# Patient Record
Sex: Female | Born: 1995
Health system: Southern US, Community
[De-identification: ages and names within clinical notes are randomized; demographics above are authoritative.]

## PROBLEM LIST (undated history)

## (undated) DIAGNOSIS — B019 Varicella without complication: Secondary | ICD-10-CM

## (undated) DIAGNOSIS — F419 Anxiety disorder, unspecified: Secondary | ICD-10-CM

## (undated) HISTORY — PX: WISDOM TOOTH EXTRACTION: SHX21

## (undated) HISTORY — DX: Anxiety disorder, unspecified: F41.9

## (undated) HISTORY — DX: Varicella without complication: B01.9

---
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
## 2014-05-12 DIAGNOSIS — E282 Polycystic ovarian syndrome: Secondary | ICD-10-CM

## 2014-05-12 HISTORY — DX: Polycystic ovarian syndrome: E28.2

## 2015-01-24 ENCOUNTER — Encounter: Payer: Self-pay | Admitting: Nurse Practitioner

## 2015-01-24 ENCOUNTER — Ambulatory Visit (INDEPENDENT_AMBULATORY_CARE_PROVIDER_SITE_OTHER): Payer: No Typology Code available for payment source | Admitting: Nurse Practitioner

## 2015-01-24 ENCOUNTER — Encounter (INDEPENDENT_AMBULATORY_CARE_PROVIDER_SITE_OTHER): Payer: Self-pay

## 2015-01-24 VITALS — BP 106/80 | HR 67 | Temp 97.9°F | Resp 14 | Ht 64.0 in | Wt 181.4 lb

## 2015-01-24 DIAGNOSIS — N926 Irregular menstruation, unspecified: Secondary | ICD-10-CM | POA: Diagnosis not present

## 2015-01-24 DIAGNOSIS — L680 Hirsutism: Secondary | ICD-10-CM

## 2015-01-24 DIAGNOSIS — Z7189 Other specified counseling: Secondary | ICD-10-CM | POA: Diagnosis not present

## 2015-01-24 DIAGNOSIS — Z7689 Persons encountering health services in other specified circumstances: Secondary | ICD-10-CM | POA: Insufficient documentation

## 2015-01-24 DIAGNOSIS — E669 Obesity, unspecified: Secondary | ICD-10-CM

## 2015-01-24 NOTE — Patient Instructions (Signed)
Below is not a diagnosis just some info to read about   Follow up in 1 month.   Polycystic Ovarian Syndrome Polycystic ovarian syndrome (PCOS) is a common hormonal disorder among women of reproductive age. Most women with PCOS grow many small cysts on their ovaries. PCOS can cause problems with your periods and make it difficult to get pregnant. It can also cause an increased risk of miscarriage with pregnancy. If left untreated, PCOS can lead to serious health problems, such as diabetes and heart disease. CAUSES The cause of PCOS is not fully understood, but genetics may be a factor. SIGNS AND SYMPTOMS   Infrequent or no menstrual periods.   Inability to get pregnant (infertility) because of not ovulating.   Increased growth of hair on the face, chest, stomach, back, thumbs, thighs, or toes.   Acne, oily skin, or dandruff.   Pelvic pain.   Weight gain or obesity, usually carrying extra weight around the waist.   Type 2 diabetes.   High cholesterol.   High blood pressure.   Female-pattern baldness or thinning hair.   Patches of thickened and dark brown or black skin on the neck, arms, breasts, or thighs.   Tiny excess flaps of skin (skin tags) in the armpits or neck area.   Excessive snoring and having breathing stop at times while asleep (sleep apnea).   Deepening of the voice.   Gestational diabetes when pregnant.  DIAGNOSIS  There is no single test to diagnose PCOS.   Your health care provider will:   Take a medical history.   Perform a pelvic exam.   Have ultrasonography done.   Check your female and female hormone levels.   Measure glucose or sugar levels in the blood.   Do other blood tests.   If you are producing too many female hormones, your health care provider will make sure it is from PCOS. At the physical exam, your health care provider will want to evaluate the areas of increased hair growth. Try to allow natural hair growth for  a few days before the visit.   During a pelvic exam, the ovaries may be enlarged or swollen because of the increased number of small cysts. This can be seen more easily by using vaginal ultrasonography or screening to examine the ovaries and lining of the uterus (endometrium) for cysts. The uterine lining may become thicker if you have not been having a regular period.  TREATMENT  Because there is no cure for PCOS, it needs to be managed to prevent problems. Treatments are based on your symptoms. Treatment is also based on whether you want to have a baby or whether you need contraception.  Treatment may include:   Progesterone hormone to start a menstrual period.   Birth control pills to make you have regular menstrual periods.   Medicines to make you ovulate, if you want to get pregnant.   Medicines to control your insulin.   Medicine to control your blood pressure.   Medicine and diet to control your high cholesterol and triglycerides in your blood.  Medicine to reduce excessive hair growth.  Surgery, making small holes in the ovary, to decrease the amount of female hormone production. This is done through a long, lighted tube (laparoscope) placed into the pelvis through a tiny incision in the lower abdomen.  HOME CARE INSTRUCTIONS  Only take over-the-counter or prescription medicine as directed by your health care provider.  Pay attention to the foods you eat and your activity  levels. This can help reduce the effects of PCOS.  Keep your weight under control.  Eat foods that are low in carbohydrate and high in fiber.  Exercise regularly. SEEK MEDICAL CARE IF:  Your symptoms do not get better with medicine.  You have new symptoms. Document Released: 08/22/2004 Document Revised: 02/16/2013 Document Reviewed: 10/14/2012 [facility] Patient Information 2015 [place], [place]. This information is not intended to replace advice given to you by your health care provider. Make sure  you discuss any questions you have with your health care provider.

## 2015-01-24 NOTE — Assessment & Plan Note (Signed)
Discussed acute and chronic issues. Reviewed health maintenance measures, PFSHx, and immunizations. Obtain records from previous facility.   

## 2015-01-24 NOTE — Addendum Note (Signed)
Addended by: Carollee Leitz on: 01/24/2015 10:13 AM   Modules accepted: Orders

## 2015-01-24 NOTE — Assessment & Plan Note (Signed)
BMI is 31. Weight gain over the last year of approximately 30-40 pounds. Patient is watching portions and eating healthy foods reportedly. Patient is exercising at the gym twice a week for 1 hour. Will obtain pelvic ultrasound for further investigation and possible PCOS. FU in 1 month

## 2015-01-24 NOTE — Progress Notes (Signed)
Patient ID: Karen Guerra, female    DOB: [birth year]  Age: 19 y.o. MRN: [id number]  CC: Establish Care   HPI Karen Guerra presents for establishing care and CC of irregular periods, wt gain, and hair growth.   1) New pt info:  Immunizations- Unknown; flu refused   Eye Exam- Glasses, Not UTD   LMP- 9/2- 9/7 2016  2) Chronic Problems-  None  3) Acute Problems-  Weight gain- Last year 145-155 lbs Diet- Sparkling water, small portions, tries to eat healthy  Exercise- started going to the gym- treadmill and strength goes 2 x a week for 1 hour   Irregular menses- Has one every other month  Menarchy started in middle school 7th grade 19 yo     Been like this since 19 yo  Cramps- more on left side  Lasts 5-6 days   Heavy at first then lighter at the end   Going through 1 pad or tampon in an hour for 2 days  Hair growth- recently noticed by mother in August. Occurring on neck and some on chin  History Karen Guerra has a past medical history of Chicken pox.   She has past surgical history that includes broken arm (Left, 2000).   Her family history includes Cancer in her maternal grandfather; Diabetes in her maternal grandfather; Heart disease in her paternal grandfather; Hyperlipidemia in her maternal grandfather, maternal grandmother, mother, paternal grandfather, and paternal grandmother; Hypertension in her maternal grandfather.She reports that she has never smoked. She has never used smokeless tobacco. She reports that she does not drink alcohol or use illicit drugs.  No outpatient prescriptions prior to visit.   No facility-administered medications prior to visit.    ROS Review of Systems  Constitutional: Positive for unexpected weight change. Negative for fever, chills, diaphoresis and fatigue.  HENT: Negative for tinnitus and trouble swallowing.   Respiratory: Negative for chest tightness, shortness of breath and wheezing.   Cardiovascular: Negative for chest pain,  palpitations and leg swelling.  Gastrointestinal: Negative for nausea, vomiting, diarrhea and constipation.  Genitourinary: Positive for menstrual problem.  Skin: Negative for rash.  Neurological: Negative for dizziness, weakness, numbness and headaches.  Psychiatric/Behavioral: Negative for suicidal ideas and sleep disturbance. The patient is nervous/anxious.     Objective:  BP 106/80 mmHg  Pulse 67  Temp(Src) 97.9 F (36.6 C)  Resp 14  Ht 5\' 4"  (1.626 m)  Wt 181 lb 6.4 oz (82.283 kg)  BMI 31.12 kg/m2  SpO2 96%  Physical Exam  Constitutional: She is oriented to person, place, and time. She appears well-developed and well-nourished. No distress.  HENT:  Head: Normocephalic and atraumatic.  Right Ear: External ear normal.  Left Ear: External ear normal.  Eyes: Right eye exhibits no discharge. Left eye exhibits no discharge. No scleral icterus.  Glasses  Neck:  Hair growth on neck to chin  Cardiovascular: Normal rate, regular rhythm and normal heart sounds.  Exam reveals no gallop and no friction rub.   No murmur heard. Pulmonary/Chest: Effort normal and breath sounds normal. No respiratory distress. She has no wheezes. She has no rales. She exhibits no tenderness.  Abdominal: Soft. Bowel sounds are normal. She exhibits no distension and no mass. There is no tenderness. There is no rebound and no guarding.  Striae are silver/white on sides of abdomen  Neurological: She is alert and oriented to person, place, and time. No cranial nerve deficit. She exhibits normal muscle tone. Coordination normal.  Skin: Skin is warm and dry.  No rash noted. She is not diaphoretic.  Psychiatric: She has a normal mood and affect. Her behavior is normal. Judgment and thought content normal.  Anxious, making good eye contact   Assessment & Plan:   Karen Guerra was seen today for establish care.  Diagnoses and all orders for this visit:  Obesity -     US Pelvis Limited; Future -     US Transvaginal  Non-OB; Future  Hirsutism -     US Pelvis Limited; Future -     US Transvaginal Non-OB; Future  Irregular menses -     US Pelvis Limited; Future -     US Transvaginal Non-OB; Future  Ms. Pitkin does not currently have medications on file.  No orders of the defined types were placed in this encounter.     Follow-up: Return in about 4 weeks (around 02/21/2015) for [place] results and anxiety/depression.

## 2015-01-24 NOTE — Progress Notes (Signed)
Pre visit review using our clinic review tool, if applicable. No additional management support is needed unless otherwise documented below in the visit note. 

## 2015-01-24 NOTE — Assessment & Plan Note (Signed)
Suspect PCOS. Will obtain pelvic ultrasound. Follow-up in one month for discussion. Patient is currently not using any form of birth control.

## 2015-01-24 NOTE — Assessment & Plan Note (Signed)
New onset. Symptom possibly related to weight gain and irregular menses. Discussed endocrinology follow-up pelvic ultrasound is positive for cysts or other findings. Pt is agreeable. FU in 1 month

## 2015-01-29 ENCOUNTER — Ambulatory Visit: Payer: No Typology Code available for payment source

## 2015-01-31 ENCOUNTER — Ambulatory Visit
Admission: RE | Admit: 2015-01-31 | Discharge: 2015-01-31 | Disposition: A | Discharge status: Home / Self Care | Payer: No Typology Code available for payment source | Source: Ambulatory Visit | Attending: Nurse Practitioner | Admitting: Nurse Practitioner

## 2015-01-31 DIAGNOSIS — N926 Irregular menstruation, unspecified: Secondary | ICD-10-CM | POA: Insufficient documentation

## 2015-01-31 DIAGNOSIS — L680 Hirsutism: Secondary | ICD-10-CM | POA: Diagnosis present

## 2015-01-31 DIAGNOSIS — E669 Obesity, unspecified: Secondary | ICD-10-CM | POA: Diagnosis present

## 2015-02-07 ENCOUNTER — Encounter: Payer: Self-pay | Admitting: *Deleted

## 2015-02-21 ENCOUNTER — Encounter: Payer: Self-pay | Admitting: Nurse Practitioner

## 2015-02-21 ENCOUNTER — Ambulatory Visit (INDEPENDENT_AMBULATORY_CARE_PROVIDER_SITE_OTHER): Payer: No Typology Code available for payment source | Admitting: Nurse Practitioner

## 2015-02-21 VITALS — BP 106/64 | HR 75 | Temp 98.2°F | Resp 12 | Ht 64.0 in | Wt 179.0 lb

## 2015-02-21 DIAGNOSIS — Z7251 High risk heterosexual behavior: Secondary | ICD-10-CM

## 2015-02-21 DIAGNOSIS — N926 Irregular menstruation, unspecified: Secondary | ICD-10-CM | POA: Diagnosis not present

## 2015-02-21 NOTE — Progress Notes (Signed)
Patient ID: Karen Guerra, female    DOB: [birth year]  Age: 19 y.o. MRN: [id number]  CC: Follow-up   HPI Karen Guerra presents for CC of discussion of US results.   1) 01/31/15 had Transvaginal non-OB and Pelvic ultrasound Everything was normal Patient denies starting a period since last visit Patient is slightly frustrated that nothing was on the ultrasound.  Willing to try a OCP. Willing to see OB/GYN- interested in Mirena IUD. Unprotected sexual intercourse since last visit.  History Karen Guerra has a past medical history of Chicken pox.   She has past surgical history that includes broken arm (Left, 2000).   Her family history includes Cancer in her maternal grandfather; Diabetes in her maternal grandfather; Heart disease in her paternal grandfather; Hyperlipidemia in her maternal grandfather, maternal grandmother, mother, paternal grandfather, and paternal grandmother; Hypertension in her maternal grandfather.She reports that she has never smoked. She has never used smokeless tobacco. She reports that she does not drink alcohol or use illicit drugs.  No outpatient prescriptions prior to visit.   No facility-administered medications prior to visit.    ROS Review of Systems  Constitutional: Negative for fever, chills, diaphoresis and fatigue.  HENT:       Hair growth on face and acne  Respiratory: Negative for chest tightness, shortness of breath and wheezing.   Cardiovascular: Negative for chest pain, palpitations and leg swelling.  Gastrointestinal: Negative for nausea, vomiting, abdominal pain, diarrhea and abdominal distention.  Skin: Negative for rash.  Neurological: Negative for dizziness and headaches.  Psychiatric/Behavioral: The patient is not nervous/anxious.     Objective:  BP 106/64 mmHg  Pulse 75  Temp(Src) 98.2 F (36.8 C)  Resp 12  Ht 5\' 4"  (1.626 m)  Wt 179 lb (81.194 kg)  BMI 30.71 kg/m2  SpO2 97%  LMP 01/12/2015  Physical Exam  Constitutional: She  is oriented to person, place, and time. She appears well-developed and well-nourished. No distress.  HENT:  Head: Normocephalic and atraumatic.  Right Ear: External ear normal.  Left Ear: External ear normal.  Neurological: She is alert and oriented to person, place, and time. No cranial nerve deficit. She exhibits normal muscle tone. Coordination normal.  Skin: Skin is warm and dry. No rash noted. She is not diaphoretic.  Hair growth on neck and under chin, mild acne  Psychiatric: She has a normal mood and affect. Her behavior is normal. Judgment and thought content normal.   Assessment & Plan:   Karen Guerra was seen today for follow-up.  Diagnoses and all orders for this visit:  Unprotected sex -     POCT urine pregnancy  Irregular menses   Ms. Ardelle AntonWagoner does not currently have medications on file.  No orders of the defined types were placed in this encounter.     Follow-up: Return if symptoms worsen or fail to improve.

## 2015-02-21 NOTE — Progress Notes (Signed)
Pre visit review using our clinic review tool, if applicable. No additional management support is needed unless otherwise documented below in the visit note. 

## 2015-02-21 NOTE — Assessment & Plan Note (Addendum)
POCT urine pregnancy screen results-  Patient will have to return back because she could not urinate at this visit

## 2015-02-21 NOTE — Assessment & Plan Note (Addendum)
Patient still has amenorrhea. Discussed seeing OB/GYN for further workup for endocrinology for further workup. Patient is interested in seeing OB/GYN for possible insertion of Mirena will try OCP in the meantime. Patient had unprotected sex recently so we will get a urine pregnancy screening today (patient had to return due to unable to urinate)

## 2017-04-09 IMAGING — US US TRANSVAGINAL NON-OB
1 series · 14 of 25 positions shown · non-contrast
Comparison: None

CLINICAL DATA: Irregular menses and hirsutism, weight gain

EXAM:
TRANSABDOMINAL AND TRANSVAGINAL ULTRASOUND OF PELVIS
TECHNIQUE: Both transabdominal and transvaginal ultrasound examinations of the
pelvis were performed. Transabdominal technique was performed for
global imaging of the pelvis including uterus, ovaries, adnexal
regions, and pelvic cul-de-sac. It was necessary to proceed with
endovaginal exam following the transabdominal exam to visualize the
endometrium and ovaries.

[Series 1: us transvaginal non-ob · 0.24mm/px · 14 of 104 slices shown]
[im 1/104]
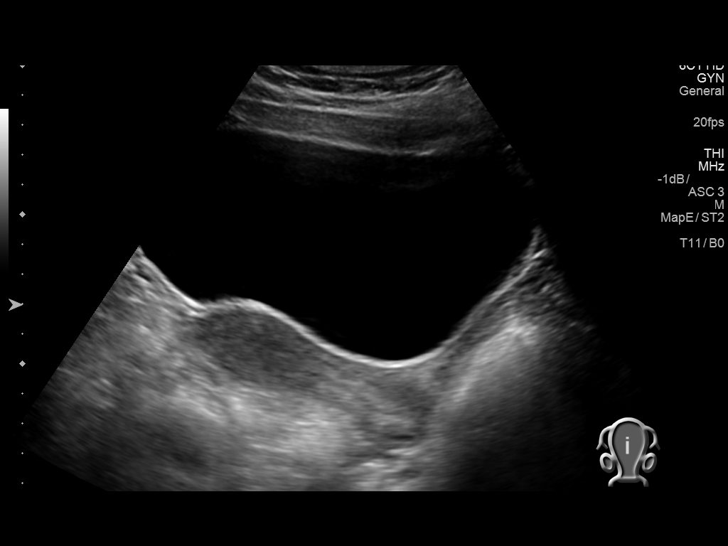
[im 9/104]
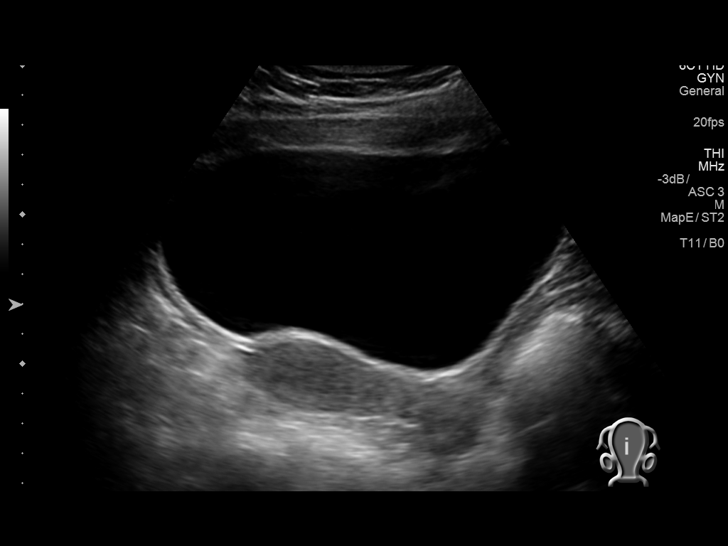
[im 18/104]
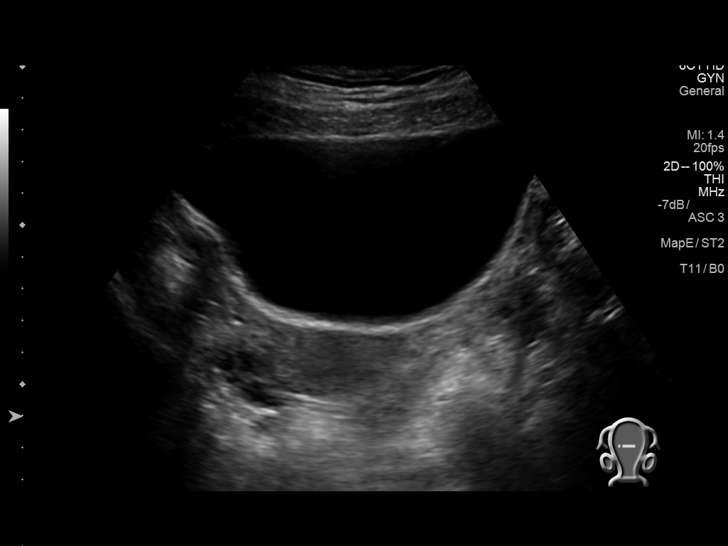
[im 26/104]
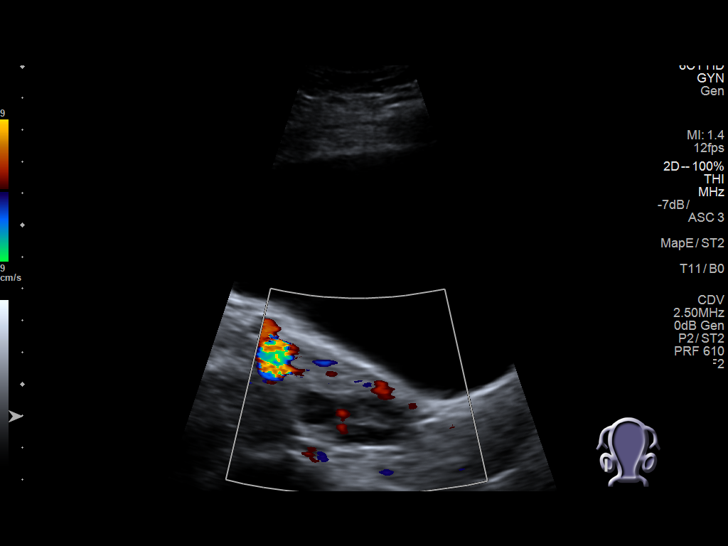
[im 35/104]
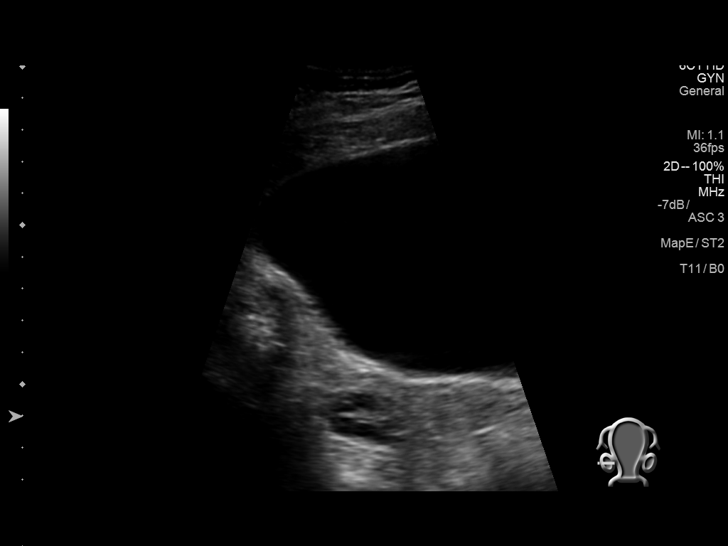
[im 39/104]
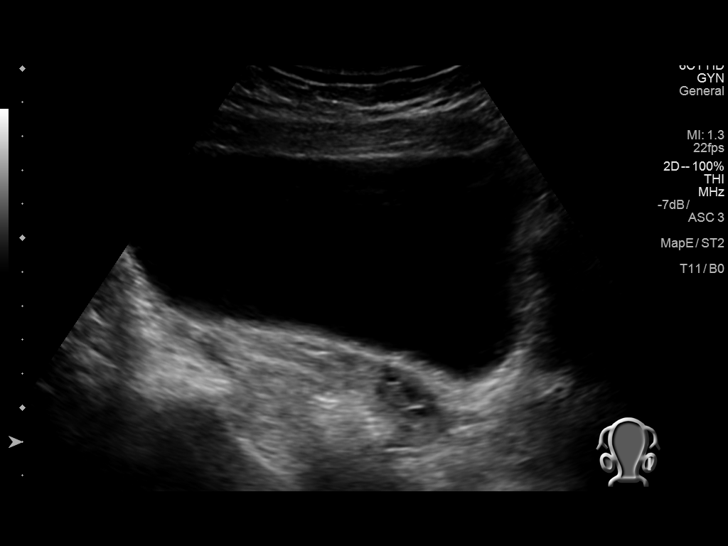
[im 48/104]
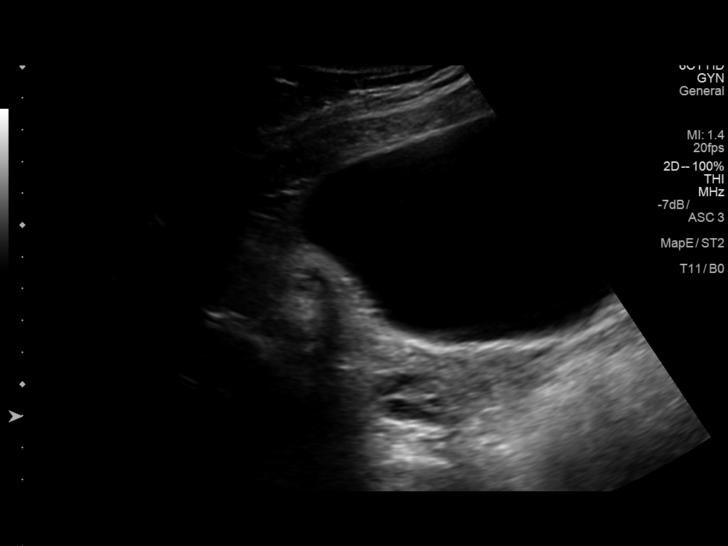
[im 56/104]
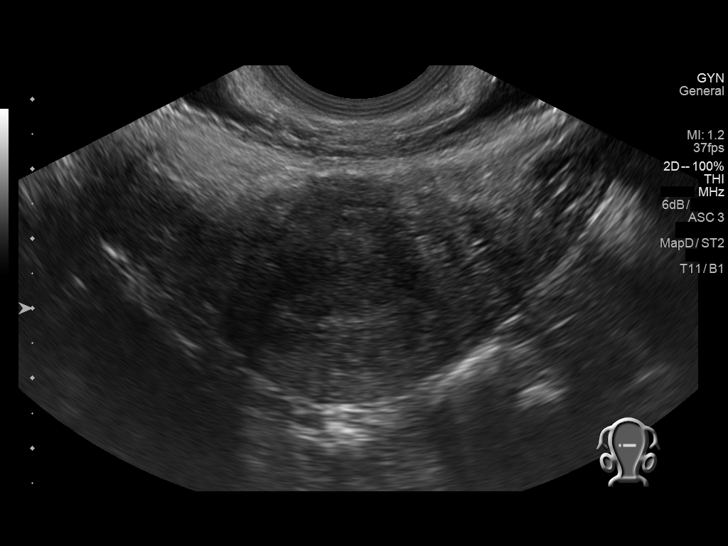
[im 65/104]
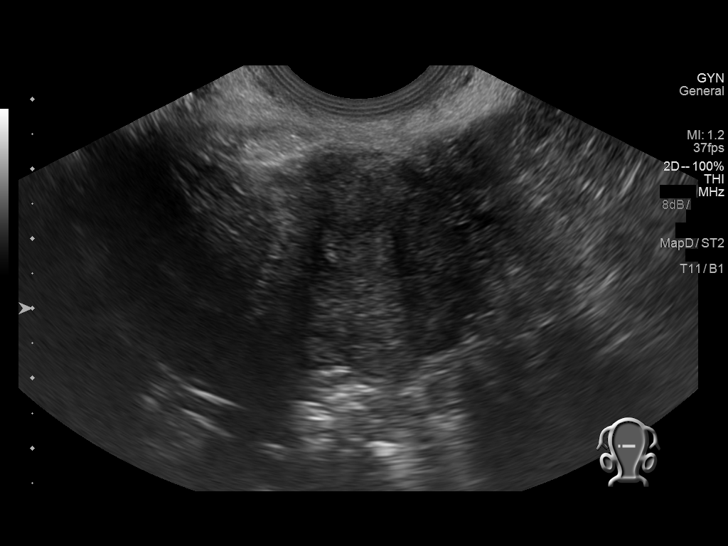
[im 69/104]
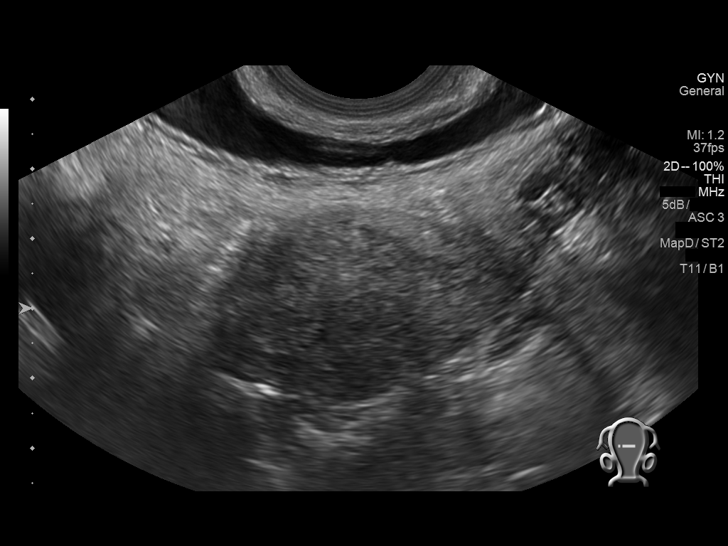
[im 78/104]
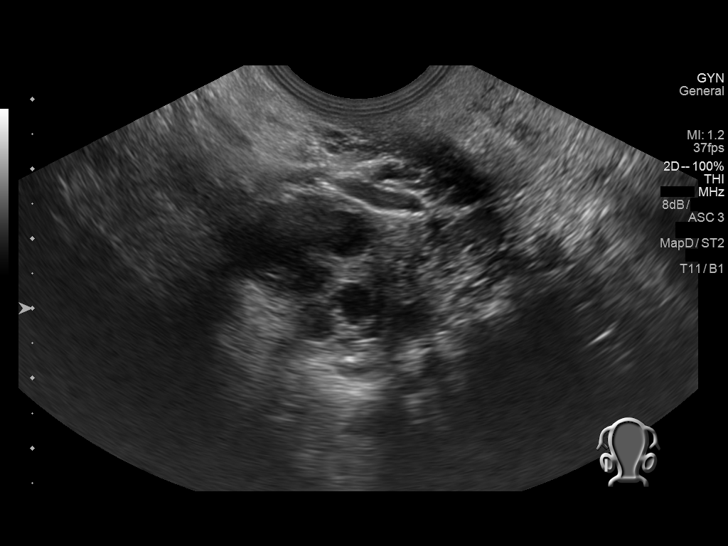
[im 86/104]
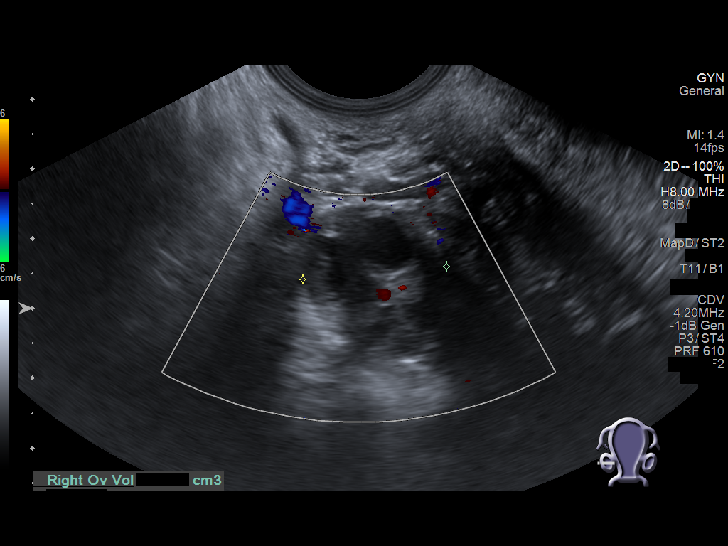
[im 95/104]
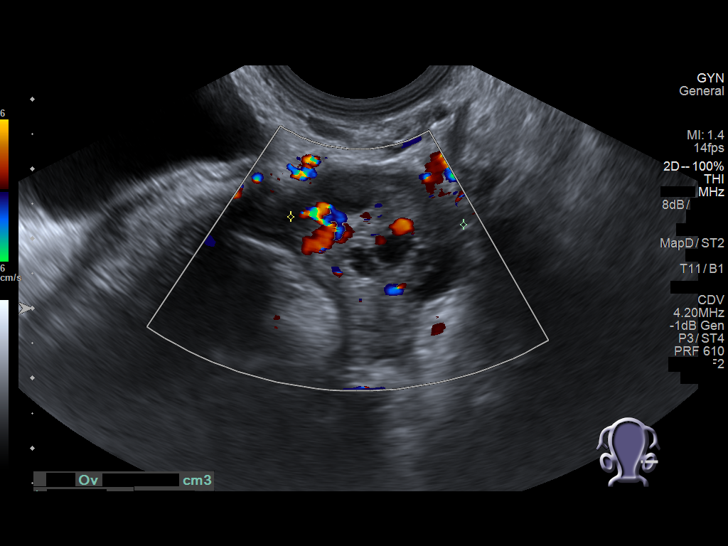
[im 104/104]
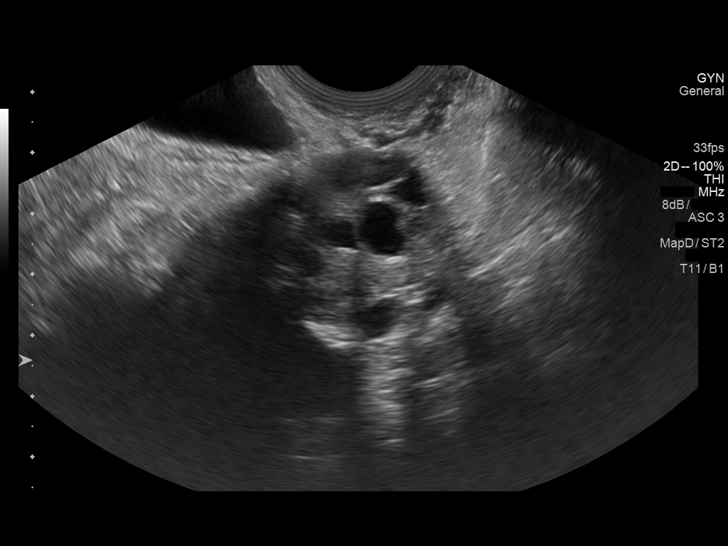

[14 of 25 positions shown; findings below may reference images not displayed]

FINDINGS: Uterus

Measurements: 6.8 x 3.1 x 3.7 cm. No fibroids or other mass
visualized.

Endometrium

Thickness: 7 mm, trilaminar in appearance. No focal abnormality
visualized.

Right ovary

Measurements: 3.2 x 2.0 x 2.1 cm. Normal appearance/no adnexal mass.

Left ovary

Measurements: 3.0 x 2.6 x 2.5 cm. Normal appearance/no adnexal mass.

Other findings

Trace free fluid in the cul-de-sac.
IMPRESSION: Normal exam.

## 2018-03-08 ENCOUNTER — Ambulatory Visit (INDEPENDENT_AMBULATORY_CARE_PROVIDER_SITE_OTHER): Payer: 59 | Admitting: Obstetrics and Gynecology

## 2018-03-08 ENCOUNTER — Other Ambulatory Visit (HOSPITAL_COMMUNITY)
Admission: RE | Admit: 2018-03-08 | Discharge: 2018-03-08 | Disposition: A | Discharge status: Home / Self Care | Payer: 59 | Source: Ambulatory Visit | Attending: Obstetrics and Gynecology | Admitting: Obstetrics and Gynecology

## 2018-03-08 ENCOUNTER — Encounter: Payer: Self-pay | Admitting: Obstetrics and Gynecology

## 2018-03-08 VITALS — BP 110/62 | HR 84 | Ht 65.0 in | Wt 200.0 lb

## 2018-03-08 DIAGNOSIS — F419 Anxiety disorder, unspecified: Principal | ICD-10-CM

## 2018-03-08 DIAGNOSIS — Z124 Encounter for screening for malignant neoplasm of cervix: Secondary | ICD-10-CM | POA: Insufficient documentation

## 2018-03-08 DIAGNOSIS — F329 Major depressive disorder, single episode, unspecified: Secondary | ICD-10-CM

## 2018-03-08 DIAGNOSIS — F418 Other specified anxiety disorders: Secondary | ICD-10-CM

## 2018-03-08 DIAGNOSIS — Z113 Encounter for screening for infections with a predominantly sexual mode of transmission: Secondary | ICD-10-CM

## 2018-03-08 DIAGNOSIS — Z30011 Encounter for initial prescription of contraceptive pills: Secondary | ICD-10-CM | POA: Diagnosis not present

## 2018-03-08 DIAGNOSIS — F32A Depression, unspecified: Secondary | ICD-10-CM

## 2018-03-08 DIAGNOSIS — Z30432 Encounter for removal of intrauterine contraceptive device: Secondary | ICD-10-CM

## 2018-03-08 MED ORDER — SERTRALINE HCL 50 MG PO TABS: 50.0000 mg | ORAL_TABLET | Freq: Every day | ORAL | 1 refills | Status: DC

## 2018-03-08 MED ORDER — NORETHIN-ETH ESTRAD-FE BIPHAS 1 MG-10 MCG / 10 MCG PO TABS: 1.0000 | ORAL_TABLET | Freq: Every day | ORAL | 3 refills | Status: DC

## 2018-03-08 NOTE — Progress Notes (Addendum)
Carollee Leitz, RN   Chief Complaint  Patient presents with  . Contraception    IUD removed, would like to go on [facility] pills, also wants to discuss anxiety/depression medication    HPI:      [name] is a 22 y.o. G0P0000 who LMP was No LMP recorded., presents today for IUD removal and to discuss anxiety/depression sx.  Liletta placed 03/29/15 for [facility]. Pt is not currently sex active but has been in past. Pt is amenorrheic with IUD, random cramping. Pt would like to change to OCPs. Hx of irreg menses prior to IUD. Has never been on OCPs in past. No hx of HTN, DVTs, seizures, migraines.   Pt also with anxiety/depression sx, anxiety worse than depression. Hx of sx in past but worse for past yr. Ready for meds (never done in past).  Has uncontrollable worry, trouble sleeping, feeling nervous, irritable, has decreased motivation, appetite changes. Driving in storms and crowds increase anxiety, but has sx daily overall. No real SI, but has fleeting thoughts. Wouldn't carry through with SI.  FH of sx.    Past Medical History:  Diagnosis Date  . Chicken pox     Past Surgical History:  Procedure Laterality Date  . broken arm Left 2000    Family History  Problem Relation Age of Onset  . Hyperlipidemia Mother   . Hyperlipidemia Maternal Grandmother   . Hypertension Maternal Grandmother   . Cancer Maternal Grandfather        Lung Cancer  . Hyperlipidemia Maternal Grandfather   . Hypertension Maternal Grandfather   . Diabetes Maternal Grandfather   . Hyperlipidemia Paternal Grandmother   . Hyperlipidemia Paternal Grandfather   . Heart disease Paternal Grandfather   . Hypertension Paternal Grandfather   . Hypertension Other   . Hypertension Other   . Prostate cancer Other     Social History   Socioeconomic History  . Marital status: Single    Spouse name: Not on file  . Number of children: Not on file  . Years of education: Not on file  . Highest education level: Not  on file  Occupational History  . Not on file  Social Needs  . Financial resource strain: Not on file  . Food insecurity:    Worry: Not on file    Inability: Not on file  . Transportation needs:    Medical: Not on file    Non-medical: Not on file  Tobacco Use  . Smoking status: Never Smoker  . Smokeless tobacco: Never Used  Substance and Sexual Activity  . Alcohol use: No    Alcohol/week: 0.0 standard drinks  . Drug use: No  . Sexual activity: Not Currently    Partners: Male    Birth control/protection: None  Lifestyle  . Physical activity:    Days per week: Not on file    Minutes per session: Not on file  . Stress: Not on file  Relationships  . Social connections:    Talks on phone: Not on file    Gets together: Not on file    Attends religious service: Not on file    Active member of club or organization: Not on file    Attends meetings of clubs or organizations: Not on file    Relationship status: Not on file  . Intimate partner violence:    Fear of current or ex partner: Not on file    Emotionally abused: Not on file    Physically  abused: Not on file    Forced sexual activity: Not on file  Other Topics Concern  . Not on file  Social History Narrative   Works as a Estate agent- Highest level education   Single   Caffeine- Coffee- 1 cup, occasional tea     Outpatient Medications Prior to Visit  Medication Sig Dispense Refill  . albuterol (VENTOLIN HFA) 108 (90 Base) MCG/ACT inhaler 1-2 inhalations every 4-6 hours as needed for wheezing. Dispense spacer as needed.     No facility-administered medications prior to visit.     ROS:  Review of Systems  Constitutional: Negative for fatigue, fever and unexpected weight change.  Respiratory: Negative for cough, shortness of breath and wheezing.   Cardiovascular: Negative for chest pain, palpitations and leg swelling.  Gastrointestinal: Negative for blood in stool, constipation, diarrhea,  nausea and vomiting.  Endocrine: Negative for cold intolerance, heat intolerance and polyuria.  Genitourinary: Negative for dyspareunia, dysuria, flank pain, frequency, genital sores, hematuria, menstrual problem, pelvic pain, urgency, vaginal bleeding, vaginal discharge and vaginal pain.  Musculoskeletal: Negative for back pain, joint swelling and myalgias.  Skin: Negative for rash.  Neurological: Negative for dizziness, syncope, light-headedness, numbness and headaches.  Hematological: Negative for adenopathy.  Psychiatric/Behavioral: Positive for agitation, dysphoric mood and sleep disturbance. Negative for confusion, self-injury and suicidal ideas. The patient is not nervous/anxious.     OBJECTIVE:   Vitals:  BP 110/62   Pulse 84   Ht 5\' 5"  (1.651 m)   Wt 200 lb (90.7 kg)   BMI 33.28 kg/m   Physical Exam  Constitutional: She is oriented to person, place, and time. Vital signs are normal. She appears well-developed.  Neck: Normal range of motion.  Pulmonary/Chest: Effort normal.  Genitourinary: Vagina normal and uterus normal. There is no rash, tenderness or lesion on the right labia. There is no rash, tenderness or lesion on the left labia. Uterus is not enlarged and not tender. Cervix exhibits no motion tenderness. Right adnexum displays no mass and no tenderness. Left adnexum displays no mass and no tenderness. No erythema or tenderness in the vagina. No vaginal discharge found.  Genitourinary Comments: IUD STRINGS IN PLACE  Musculoskeletal: Normal range of motion.  Neurological: She is alert and oriented to person, place, and time. No cranial nerve deficit.  Psychiatric: She has a normal mood and affect. Her behavior is normal. Judgment and thought content normal.  Vitals reviewed.   Results:  GAD 7 : Generalized Anxiety Score 03/08/2018  Nervous, Anxious, on Edge 2  Control/stop worrying 3  Worry too much - different things 2  Trouble relaxing 3  Restless 1  Easily  annoyed or irritable 3  Afraid - awful might happen 1  Total GAD 7 Score 15  Anxiety Difficulty Very difficult     Depression screen PHQ 2/9 03/08/2018  Decreased Interest 2  Down, Depressed, Hopeless 1  PHQ - 2 Score 3  Altered sleeping 3  Tired, decreased energy 3  Change in appetite 3  Feeling bad or failure about yourself  2  Trouble concentrating 1  Moving slowly or fidgety/restless 2  Suicidal thoughts 1  PHQ-9 Score 18  Difficult doing work/chores Very difficult     Assessment/Plan: Anxiety and depression - Sx and tx discussed. Start zoloft in 1 wk (starting OCPs today). Rx eRxd. RTO in 8 wks for f/u/sooner prn. - Plan: sertraline (ZOLOFT) 50 MG tablet  Screening for STD (sexually transmitted disease) - Plan: Cytology -  PAP  Cervical cancer screening - Plan: Cytology - PAP  Encounter for IUD removal - Removed without problems.   Encounter for initial prescription of contraceptive pills - OCP start today. Condoms. 1 sample/coupon card; Rx Lo Loestrin.  - Plan: Norethindrone-Ethinyl Estradiol-Fe Biphas (LO LOESTRIN FE) 1 MG-10 MCG / 10 MCG tablet    Meds ordered this encounter  Medications  . sertraline (ZOLOFT) 50 MG tablet    Sig: Take 1 tablet (50 mg total) by mouth daily. Take 1/2 tab daily for 6 days, then 1 tab daily    Dispense:  30 tablet    Refill:  1    Order Specific Question:   Supervising Provider    Answer:   Nadara Mustard [id number]  . Norethindrone-Ethinyl Estradiol-Fe Biphas (LO LOESTRIN FE) 1 MG-10 MCG / 10 MCG tablet    Sig: Take 1 tablet by mouth daily.    Dispense:  84 tablet    Refill:  3    Order Specific Question:   Supervising Provider    Answer:   Nadara Mustard [[id number]]    IUD Removal Strings of IUD identified and grasped.  IUD removed without problem with ring forceps.  Pt tolerated this well.  IUD noted to be intact.   Plan: IUD removed and plan for contraception is oral contraceptives (estrogen/progesterone). She was  amenable to this plan.   Return in about 8 weeks (around 05/03/2018) for anxiety/depression f/u.  Alicia B. Copland, PA-C 03/08/2018 11:00 AM

## 2018-03-08 NOTE — Patient Instructions (Signed)
I value your feedback and entrusting us with your care. If you get a [facility] patient survey, I would appreciate you taking the time to let us know about your experience today. Thank you! 

## 2018-03-10 LAB — CYTOLOGY - PAP
Chlamydia: NEGATIVE
Diagnosis: NEGATIVE
Neisseria Gonorrhea: NEGATIVE
Trichomonas: NEGATIVE

## 2018-05-26 ENCOUNTER — Other Ambulatory Visit: Payer: Self-pay | Admitting: Obstetrics and Gynecology

## 2018-05-26 DIAGNOSIS — F329 Major depressive disorder, single episode, unspecified: Secondary | ICD-10-CM

## 2018-05-26 DIAGNOSIS — F419 Anxiety disorder, unspecified: Principal | ICD-10-CM

## 2018-05-26 NOTE — Telephone Encounter (Signed)
Please advise 

## 2018-06-14 ENCOUNTER — Encounter: Payer: Self-pay | Admitting: Obstetrics and Gynecology

## 2018-06-14 ENCOUNTER — Ambulatory Visit (INDEPENDENT_AMBULATORY_CARE_PROVIDER_SITE_OTHER): Payer: BLUE CROSS/BLUE SHIELD | Admitting: Obstetrics and Gynecology

## 2018-06-14 VITALS — BP 100/60 | Ht 65.0 in | Wt 196.0 lb

## 2018-06-14 DIAGNOSIS — F419 Anxiety disorder, unspecified: Secondary | ICD-10-CM

## 2018-06-14 DIAGNOSIS — Z30014 Encounter for initial prescription of intrauterine contraceptive device: Secondary | ICD-10-CM

## 2018-06-14 DIAGNOSIS — F32A Depression, unspecified: Secondary | ICD-10-CM

## 2018-06-14 DIAGNOSIS — F329 Major depressive disorder, single episode, unspecified: Secondary | ICD-10-CM

## 2018-06-14 MED ORDER — MISOPROSTOL 100 MCG PO TABS: 100.0000 ug | ORAL_TABLET | Freq: Once | ORAL | 0 refills | Status: DC

## 2018-06-14 MED ORDER — SERTRALINE HCL 50 MG PO TABS: 50.0000 mg | ORAL_TABLET | Freq: Every day | ORAL | 2 refills | Status: DC

## 2018-06-14 NOTE — Patient Instructions (Signed)
I value your feedback and entrusting us with your care. If you get a [facility] patient survey, I would appreciate you taking the time to let us know about your experience today. Thank you! 

## 2018-06-14 NOTE — Progress Notes (Signed)
Karen Guerra, Karen M, RN   Chief Complaint  Patient presents with  . Contraception    iud  . Anxiety    HPI:      Karen Guerra is a 23 y.o. G0P0000 who LMP was Patient's last menstrual period was 05/26/2018., presents today for anxiety/depression f/u. Started on zoloft 10/19 and doing really well. Takes at night since it makes her a little tired. Anxiety/depression sx much improved. Still has occas chest pains with anxiety. Worry, motivation, insomnia much improved. No SI. Wants to cont meds.   03/08/18 NOTE: Pt also with anxiety/depression sx, anxiety worse than depression. Hx of sx in past but worse for past yr. Ready for meds (never done in past).  Has uncontrollable worry, trouble sleeping, feeling nervous, irritable, has decreased motivation, appetite changes. Driving in storms and crowds increase anxiety, but has sx daily overall. No real SI, but has fleeting thoughts. Wouldn't carry through with SI.  FH of sx.   Also wants to go back to IUD.  IUD removed 10/19 because pt wanted to try OCPs. Started Lo Loestrin then. Had bleeding for 1 1/2 wks 1/20 but no periods with placebo pills. Has trouble taking pill daily. Not sex active currently.  Had liletta placed 03/29/15.  Current on pap 10/19.  Past Medical History:  Diagnosis Date  . Chicken pox     Past Surgical History:  Procedure Laterality Date  . broken arm Left 2000    Family History  Problem Relation Age of Onset  . Hyperlipidemia Mother   . Hyperlipidemia Maternal Grandmother   . Hypertension Maternal Grandmother   . Cancer Maternal Grandfather        Lung Cancer  . Hyperlipidemia Maternal Grandfather   . Hypertension Maternal Grandfather   . Diabetes Maternal Grandfather   . Hyperlipidemia Paternal Grandmother   . Hyperlipidemia Paternal Grandfather   . Heart disease Paternal Grandfather   . Hypertension Paternal Grandfather   . Hypertension Other   . Hypertension Other   . Prostate cancer Other      Social History   Socioeconomic History  . Marital status: Single    Spouse name: Not on file  . Number of children: Not on file  . Years of education: Not on file  . Highest education level: Not on file  Occupational History  . Not on file  Social Needs  . Financial resource strain: Not on file  . Food insecurity:    Worry: Not on file    Inability: Not on file  . Transportation needs:    Medical: Not on file    Non-medical: Not on file  Tobacco Use  . Smoking status: Never Smoker  . Smokeless tobacco: Never Used  Substance and Sexual Activity  . Alcohol use: No    Alcohol/week: 0.0 standard drinks  . Drug use: No  . Sexual activity: Not Currently    Partners: Male    Birth control/protection: None  Lifestyle  . Physical activity:    Days per week: Not on file    Minutes per session: Not on file  . Stress: Not on file  Relationships  . Social connections:    Talks on phone: Not on file    Gets together: Not on file    Attends religious service: Not on file    Active member of club or organization: Not on file    Attends meetings of clubs or organizations: Not on file    Relationship status: Not on  file  . Intimate partner violence:    Fear of current or ex partner: Not on file    Emotionally abused: Not on file    Physically abused: Not on file    Forced sexual activity: Not on file  Other Topics Concern  . Not on file  Social History Narrative   Works as a Estate agenthair stylist    [employer]- Highest level education   Single   Caffeine- Coffee- 1 cup, occasional tea     Outpatient Medications Prior to Visit  Medication Sig Dispense Refill  . albuterol (VENTOLIN HFA) 108 (90 Base) MCG/ACT inhaler 1-2 inhalations every 4-6 hours as needed for wheezing. Dispense spacer as needed.    . Norethindrone-Ethinyl Estradiol-Fe Biphas (LO LOESTRIN FE) 1 MG-10 MCG / 10 MCG tablet Take 1 tablet by mouth daily. 84 tablet 3  . sertraline (ZOLOFT) 50 MG tablet Take 1  tablet (50 mg total) by mouth daily. Take 1/2 tab daily for 6 days, then 1 tab daily 30 tablet 1   No facility-administered medications prior to visit.       ROS:  Review of Systems  Constitutional: Negative for fatigue, fever and unexpected weight change.  Respiratory: Negative for cough, shortness of breath and wheezing.   Cardiovascular: Negative for chest pain, palpitations and leg swelling.  Gastrointestinal: Negative for blood in stool, constipation, diarrhea, nausea and vomiting.  Endocrine: Negative for cold intolerance, heat intolerance and polyuria.  Genitourinary: Negative for dyspareunia, dysuria, flank pain, frequency, genital sores, hematuria, menstrual problem, pelvic pain, urgency, vaginal bleeding, vaginal discharge and vaginal pain.  Musculoskeletal: Negative for back pain, joint swelling and myalgias.  Skin: Negative for rash.  Neurological: Negative for dizziness, syncope, light-headedness, numbness and headaches.  Hematological: Negative for adenopathy.  Psychiatric/Behavioral: Positive for agitation and dysphoric mood. Negative for confusion, sleep disturbance and suicidal ideas. The patient is not nervous/anxious.     OBJECTIVE:   Vitals:  BP 100/60   Ht 5\' 5"  (1.651 Guerra)   Wt 196 lb (88.9 kg)   LMP 05/26/2018   BMI 32.62 kg/Guerra   Physical Exam Vitals signs reviewed.  Constitutional:      Appearance: She is well-developed.  Neck:     Musculoskeletal: Normal range of motion.  Pulmonary:     Effort: Pulmonary effort is normal.  Musculoskeletal: Normal range of motion.  Neurological:     Mental Status: She is alert and oriented to person, place, and time.     Cranial Nerves: No cranial nerve deficit.  Psychiatric:        Behavior: Behavior normal.        Thought Content: Thought content normal.        Judgment: Judgment normal.     Results:  GAD 7 : Generalized Anxiety Score 06/14/2018 03/08/2018  Nervous, Anxious, on Edge 1 2  Control/stop  worrying 0 3  Worry too much - different things 0 2  Trouble relaxing 0 3  Restless 1 1  Easily annoyed or irritable 1 3  Afraid - awful might happen 0 1  Total GAD 7 Score 3 15  Anxiety Difficulty - Very difficult    Depression screen PHQ 2/9 06/14/2018  Decreased Interest 0  Down, Depressed, Hopeless 0  PHQ - 2 Score 0  Altered sleeping 1  Tired, decreased energy 1  Change in appetite 0  Feeling bad or failure about yourself  0  Trouble concentrating 1  Moving slowly or fidgety/restless 1  Suicidal thoughts -  PHQ-9  Score 4  Difficult doing work/chores -     Assessment/Plan: Anxiety and depression - Sx much improved with zoloft. Rx RF. F/u prn.  - Plan: sertraline (ZOLOFT) 50 MG tablet  Encounter for initial prescription of intrauterine contraceptive device (IUD) - Pt would like another IUD. RTO with menses for Liletta (had in past). Rx cytotec/NSAIDs.  - Plan: misoprostol (CYTOTEC) 100 MCG tablet    Meds ordered this encounter  Medications  . misoprostol (CYTOTEC) 100 MCG tablet    Sig: Take 1 tablet (100 mcg total) by mouth once for 1 dose. 1 hour before appt    Dispense:  1 tablet    Refill:  0    Order Specific Question:   Supervising Provider    Answer:   Nadara Mustard [id number]  . sertraline (ZOLOFT) 50 MG tablet    Sig: Take 1 tablet (50 mg total) by mouth daily.    Dispense:  90 tablet    Refill:  2    Order Specific Question:   Supervising Provider    Answer:   Nadara Mustard [[id number]]      Return if symptoms worsen or fail to improve.  Karen Haslip B. Akansha Wyche, PA-C 06/14/2018 11:33 AM

## 2018-06-23 ENCOUNTER — Telehealth: Payer: Self-pay | Admitting: Obstetrics and Gynecology

## 2018-06-23 NOTE — Telephone Encounter (Signed)
Patient is schedule for 06/28/18 with [facility] for liletta insertion

## 2018-06-28 ENCOUNTER — Ambulatory Visit: Payer: BLUE CROSS/BLUE SHIELD | Admitting: Obstetrics and Gynecology

## 2018-06-28 NOTE — Telephone Encounter (Signed)
Patient reschedule to 06/30/18 at 10;50

## 2018-06-30 ENCOUNTER — Encounter: Payer: Self-pay | Admitting: Obstetrics and Gynecology

## 2018-06-30 ENCOUNTER — Ambulatory Visit (INDEPENDENT_AMBULATORY_CARE_PROVIDER_SITE_OTHER): Payer: BLUE CROSS/BLUE SHIELD | Admitting: Obstetrics and Gynecology

## 2018-06-30 VITALS — BP 108/70 | HR 84 | Ht 65.0 in | Wt 198.0 lb

## 2018-06-30 DIAGNOSIS — Z30430 Encounter for insertion of intrauterine contraceptive device: Secondary | ICD-10-CM

## 2018-06-30 MED ORDER — LEVONORGESTREL 19.5 MCG/DAY IU IUD: 1.0000 | INTRAUTERINE_SYSTEM | Freq: Once | INTRAUTERINE | 0 refills | Status: DC

## 2018-06-30 NOTE — Patient Instructions (Addendum)
I value your feedback and entrusting us with your care. If you get a [facility] patient survey, I would appreciate you taking the time to let us know about your experience today. Thank you!  [facility] [phone]  Instructions after IUD insertion  Most women experience no significant problems after insertion of an IUD, however minor cramping and spotting for a few days is common. Cramps may be treated with ibuprofen 800mg every 8 hours or Tylenol 650 mg every 4 hours. Contact [facility] immediately if you experience any of the following symptoms during the next week: temperature >99.6 degrees, worsening pelvic pain, abdominal pain, fainting, unusually heavy vaginal bleeding, foul vaginal discharge, or if you think you have expelled the IUD.  Nothing inserted in the vagina for 48 hours. You will be scheduled for a follow up visit in approximately four weeks.  You should check monthly to be sure you can feel the IUD strings in the upper vagina. If you are having a monthly period, try to check after each period. If you cannot feel the IUD strings,  contact [facility] immediately so we can do an exam to determine if the IUD has been expelled.   Please use backup protection until we can confirm the IUD is in place.  Call [facility] if you are exposed to or diagnosed with a sexually transmitted infection, as we will need to discuss whether it is safe for you to continue using an IUD.   

## 2018-06-30 NOTE — Progress Notes (Signed)
   Chief Complaint  Patient presents with  . Contraception    Liletta insertion     IUD PROCEDURE NOTE:  Karen Guerra is a 23 y.o. G0P0000 here for Liletta  IUD insertion for [facility]. Had IUD in past and had it removed to try OCPs. Couldn't remember pills daily and had BTB. Wants another IUD.  Neg pap/STD testing 10/19.   BP 108/70   Pulse 84   Ht 5\' 5"  (1.651 m)   Wt 198 lb (89.8 kg)   LMP 06/29/2018 (Exact Date)   BMI 32.95 kg/m   IUD Insertion Procedure Note Patient identified, informed consent performed, consent signed.   Discussed risks of irregular bleeding, cramping, infection, malpositioning or misplacement of the IUD outside the uterus which may require further procedure such as laparoscopy, risk of failure <1%. Time out was performed.    Speculum placed in the vagina.  Cervix visualized.  Cleaned with Betadine x 2.  Grasped anteriorly with a single tooth tenaculum.  Uterus sounded to 7.0 cm.   IUD placed per manufacturer's recommendations.  Strings trimmed to 3 cm. Tenaculum was removed, good hemostasis noted.  Patient tolerated procedure well.   ASSESSMENT:  Encounter for insertion of intrauterine contraceptive device (IUD) - Plan: Levonorgestrel (LILETTA) 19.5 MCG/DAY IUD IUD   Meds ordered this encounter  Medications  . Levonorgestrel (LILETTA) 19.5 MCG/DAY IUD IUD    Sig: 1 Intra Uterine Device (1 each total) by Intrauterine route once for 1 dose.    Dispense:  1 each    Refill:  0    Order Specific Question:   Supervising Provider    Answer:   Nadara Mustard [[id number]]     Plan:  Patient was given post-procedure instructions.  She was advised to have backup contraception for one week.   Call if you are having increasing pain, cramps or bleeding or if you have a fever greater than 100.4 degrees F., shaking chills, nausea or vomiting. Patient was also asked to check IUD strings periodically and follow up in 4 weeks for IUD check.  Return in about 4 weeks  (around 07/28/2018) for IUD f/u.  Avice Funchess B. Tanita Palinkas, PA-C 06/30/2018 11:13 AM

## 2018-07-26 ENCOUNTER — Ambulatory Visit (INDEPENDENT_AMBULATORY_CARE_PROVIDER_SITE_OTHER): Payer: Commercial Managed Care - PPO | Admitting: Obstetrics and Gynecology

## 2018-07-26 ENCOUNTER — Encounter: Payer: Self-pay | Admitting: Obstetrics and Gynecology

## 2018-07-26 ENCOUNTER — Other Ambulatory Visit: Payer: Self-pay

## 2018-07-26 VITALS — BP 112/70 | HR 81 | Ht 66.0 in | Wt 198.0 lb

## 2018-07-26 DIAGNOSIS — Z30431 Encounter for routine checking of intrauterine contraceptive device: Secondary | ICD-10-CM

## 2018-07-26 NOTE — Patient Instructions (Signed)
I value your feedback and entrusting us with your care. If you get a  patient survey, I would appreciate you taking the time to let us know about your experience today. Thank you! 

## 2018-07-26 NOTE — Progress Notes (Signed)
   Chief Complaint  Patient presents with  . IUD check     History of Present Illness:  Karen Guerra is a 23 y.o. that had a Liletta IUD placed approximately 1 month ago. Since that time, she denies  pelvic pain, vaginal d/c, heavy bleeding. Had occas spotting/cramping that has improved.  Still doing well on zoloft for anxiety/depression.   Review of Systems  Constitutional: Negative for fever.  Gastrointestinal: Negative for blood in stool, constipation, diarrhea, nausea and vomiting.  Genitourinary: Positive for vaginal bleeding and vaginal discharge. Negative for dyspareunia, dysuria, flank pain, frequency, hematuria, urgency and vaginal pain.  Musculoskeletal: Negative for back pain.  Skin: Negative for rash.    Physical Exam:  BP 112/70   Pulse 81   Ht 5\' 6"  (1.676 m)   Wt 198 lb (89.8 kg)   LMP 06/29/2018 (Exact Date)   BMI 31.96 kg/m  Body mass index is 31.96 kg/m.  Pelvic exam:  Two IUD strings present seen coming from the cervical os. EGBUS, vaginal vault and cervix: within normal limits   Assessment:   Encounter for routine checking of intrauterine contraceptive device (IUD)  IUD strings present in proper location; pt doing well  Plan: F/u if any signs of infection or can no longer feel the strings.  Return in about 7 months (around 02/25/2019) for annual.  Alicia B. Copland, PA-C 07/26/2018 11:09 AM

## 2018-07-28 ENCOUNTER — Ambulatory Visit: Payer: BLUE CROSS/BLUE SHIELD | Admitting: Obstetrics and Gynecology

## 2018-08-10 NOTE — Telephone Encounter (Signed)
Pt rcvd & charged Liletta & insertion 06/30/2018

## 2019-04-24 NOTE — Progress Notes (Signed)
[name], RN (Inactive)   Chief Complaint  Patient presents with  . Anxiety    pt thinks zoloft is not working    HPI:      [name] is a 23 y.o. G0P0000 who LMP was Patient's last menstrual period was 04/04/2019 (approximate)., presents today for anxiety/depression f/u. Started on zoloft 10/19 with 50 mg dose and did well until about a month ago. Has noticed increased chest pains with anxiety, worry, decreased motivation, worsening insomnia. Had SI but wouldn't follow through with it. No triggers/changes to affect sx control with zoloft. Only getting about 4 hrs sleep nightly due to difficulty falling asleep. Has tried melatonin but doesn't know dose. Using phone/tv before bed. Occas drinks caffeine. Has not seen therapist and is considering it. Not exercising but is active at work.    Liletta placed 2/20, and pt likes it. Menses QO months. Hx of PCOS.   FH thryoid disorder. Hasn't had recent thyroid labs. Will check today.  Patient Active Problem List   Diagnosis Date Noted  . Anxiety and depression 04/25/2019  . Unprotected sex 02/21/2015  . Obesity 01/24/2015  . Hirsutism 01/24/2015  . Irregular menses 01/24/2015  . Encounter to establish care 01/24/2015  . URI 06/04/2009  . BARIATRIC SURGERY STATUS 09/14/2008    Past Surgical History:  Procedure Laterality Date  . broken arm Left 2000    Family History  Problem Relation Age of Onset  . Hyperlipidemia Mother   . Hyperlipidemia Maternal Grandmother   . Hypertension Maternal Grandmother   . Cancer Maternal Grandfather        Lung Cancer  . Hyperlipidemia Maternal Grandfather   . Hypertension Maternal Grandfather   . Diabetes Maternal Grandfather   . Hyperlipidemia Paternal Grandmother   . Hyperlipidemia Paternal Grandfather   . Heart disease Paternal Grandfather   . Hypertension Paternal Grandfather   . Hypertension Other   . Hypertension Other   . Prostate cancer Other     Social History     Socioeconomic History  . Marital status: Single    Spouse name: Not on file  . Number of children: Not on file  . Years of education: Not on file  . Highest education level: Not on file  Occupational History  . Not on file  Tobacco Use  . Smoking status: Never Smoker  . Smokeless tobacco: Never Used  Substance and Sexual Activity  . Alcohol use: No    Alcohol/week: 0.0 standard drinks  . Drug use: No  . Sexual activity: Not Currently    Partners: Male    Birth control/protection: I.U.D.    Comment: Liletta  Other Topics Concern  . Not on file  Social History Narrative   Works as a Writer- Highest level education   Single   Caffeine- Coffee- 1 cup, occasional tea    Social Determinants of Radio broadcast assistant Strain:   . Difficulty of Paying Living Expenses: Not on file  Food Insecurity:   . Worried About Charity fundraiser in the Last Year: Not on file  . Ran Out of Food in the Last Year: Not on file  Transportation Needs:   . Lack of Transportation (Medical): Not on file  . Lack of Transportation (Non-Medical): Not on file  Physical Activity:   . Days of Exercise per Week: Not on file  . Minutes of Exercise per Session: Not on file  Stress:   .  Feeling of Stress : Not on file  Social Connections:   . Frequency of Communication with Friends and Family: Not on file  . Frequency of Social Gatherings with Friends and Family: Not on file  . Attends Religious Services: Not on file  . Active Member of Clubs or Organizations: Not on file  . Attends BankerClub or Organization Meetings: Not on file  . Marital Status: Not on file  Intimate Partner Violence:   . Fear of Current or Ex-Partner: Not on file  . Emotionally Abused: Not on file  . Physically Abused: Not on file  . Sexually Abused: Not on file    Outpatient Medications Prior to Visit  Medication Sig Dispense Refill  . sertraline (ZOLOFT) 50 MG tablet Take 1 tablet (50 mg total)  by mouth daily. 90 tablet 2  . albuterol (VENTOLIN HFA) 108 (90 Base) MCG/ACT inhaler 1-2 inhalations every 4-6 hours as needed for wheezing. Dispense spacer as needed.    . Levonorgestrel (LILETTA) 19.5 MCG/DAY IUD IUD 1 Intra Uterine Device (1 each total) by Intrauterine route once for 1 dose. 1 each 0   No facility-administered medications prior to visit.      ROS:  Review of Systems  Constitutional: Positive for fatigue. Negative for fever and unexpected weight change.  Respiratory: Negative for cough, shortness of breath and wheezing.   Cardiovascular: Negative for chest pain, palpitations and leg swelling.  Gastrointestinal: Negative for blood in stool, constipation, diarrhea, nausea and vomiting.  Endocrine: Negative for cold intolerance, heat intolerance and polyuria.  Genitourinary: Negative for dyspareunia, dysuria, flank pain, frequency, genital sores, hematuria, menstrual problem, pelvic pain, urgency, vaginal bleeding, vaginal discharge and vaginal pain.  Musculoskeletal: Negative for back pain, joint swelling and myalgias.  Skin: Negative for rash.  Neurological: Negative for dizziness, syncope, light-headedness, numbness and headaches.  Hematological: Negative for adenopathy.  Psychiatric/Behavioral: Positive for agitation, dysphoric mood and sleep disturbance. Negative for confusion and suicidal ideas. The patient is not nervous/anxious.    BREAST: No symptoms   OBJECTIVE:   Vitals:  BP 110/80   Ht 5\' 7"  (1.702 m)   Wt 205 lb (93 kg)   LMP 04/04/2019 (Approximate)   BMI 32.11 kg/m   Physical Exam Vitals reviewed.  Constitutional:      Appearance: She is well-developed.  Pulmonary:     Effort: Pulmonary effort is normal.  Musculoskeletal:        General: Normal range of motion.     Cervical back: Normal range of motion.  Skin:    General: Skin is warm and dry.  Neurological:     General: No focal deficit present.     Mental Status: She is alert and  oriented to person, place, and time.     Cranial Nerves: No cranial nerve deficit.  Psychiatric:        Mood and Affect: Mood normal.        Behavior: Behavior normal.        Thought Content: Thought content normal.        Judgment: Judgment normal.     Results:   GAD 7 : Generalized Anxiety Score 04/25/2019 06/14/2018 03/08/2018  Nervous, Anxious, on Edge 3 1 2   Control/stop worrying 2 0 3  Worry too much - different things 3 0 2  Trouble relaxing 3 0 3  Restless 3 1 1   Easily annoyed or irritable 1 1 3   Afraid - awful might happen 1 0 1  Total GAD 7 Score 16 3  15  Anxiety Difficulty Somewhat difficult - Very difficult    Depression screen PHQ 2/9 04/25/2019  Decreased Interest 2  Down, Depressed, Hopeless 2  PHQ - 2 Score 4  Altered sleeping 3  Tired, decreased energy 3  Change in appetite 1  Feeling bad or failure about yourself  3  Trouble concentrating 3  Moving slowly or fidgety/restless 3  Suicidal thoughts 1  PHQ-9 Score 21  Difficult doing work/chores Very difficult    Assessment/Plan: Anxiety and depression - Sx much improved with zoloft. Rx RF. F/u prn.  - Plan: sertraline (ZOLOFT) 100 MG tablet; Sx increase in past month. No triggers/stressors. Increase zoloft to 100 mg daily. F/u via phone with sx in 4 wks/sooner prn. Check thyroid labs. Sleep hygiene discussed. See if sleep sx improve with anxiety control. Recommended therapist.  Thyroid disorder screening - Plan: TSH + free T4   Meds ordered this encounter  Medications  . sertraline (ZOLOFT) 100 MG tablet    Sig: Take 1 tablet (100 mg total) by mouth daily.    Dispense:  30 tablet    Refill:  0    Order Specific Question:   Supervising Provider    Answer:   Nadara Mustard [[id number]]      Return if symptoms worsen or fail to improve.  Topacio Cella B. Raevon Broom, PA-C 04/25/2019 10:39 AM

## 2019-04-25 ENCOUNTER — Encounter: Payer: Self-pay | Admitting: Obstetrics and Gynecology

## 2019-04-25 ENCOUNTER — Ambulatory Visit (INDEPENDENT_AMBULATORY_CARE_PROVIDER_SITE_OTHER): Payer: BC Managed Care – PPO | Admitting: Obstetrics and Gynecology

## 2019-04-25 ENCOUNTER — Other Ambulatory Visit: Payer: Self-pay

## 2019-04-25 VITALS — BP 110/80 | Ht 67.0 in | Wt 205.0 lb

## 2019-04-25 DIAGNOSIS — F329 Major depressive disorder, single episode, unspecified: Secondary | ICD-10-CM

## 2019-04-25 DIAGNOSIS — Z1329 Encounter for screening for other suspected endocrine disorder: Secondary | ICD-10-CM | POA: Diagnosis not present

## 2019-04-25 DIAGNOSIS — F419 Anxiety disorder, unspecified: Secondary | ICD-10-CM | POA: Diagnosis not present

## 2019-04-25 MED ORDER — SERTRALINE HCL 100 MG PO TABS: 100.0000 mg | ORAL_TABLET | Freq: Every day | ORAL | 0 refills | Status: DC

## 2019-04-25 NOTE — Patient Instructions (Signed)
I value your feedback and entrusting us with your care. If you get a [facility] patient survey, I would appreciate you taking the time to let us know about your experience today. Thank you!  As of April 21, 2019, your lab results will be released to your MyChart immediately, before I even have a chance to see them. Please give me time to review them and contact you if there are any abnormalities. Thank you for your patience.  

## 2019-04-26 LAB — TSH+FREE T4
Free T4: 1.06 ng/dL (ref 0.82–1.77)
TSH: 1.37 u[IU]/mL (ref 0.450–4.500)

## 2019-04-26 NOTE — Progress Notes (Signed)
Called pt, no answer, LVMTRC. 

## 2019-04-26 NOTE — Progress Notes (Signed)
Pls let pt know thyroid normal. No need to see endocrine. Thx

## 2019-04-26 NOTE — Progress Notes (Signed)
Pt aware. (spoke to mom Sonia Baller as she is on [place].)

## 2019-05-17 ENCOUNTER — Other Ambulatory Visit: Payer: Self-pay | Admitting: Obstetrics and Gynecology

## 2019-05-17 DIAGNOSIS — F329 Major depressive disorder, single episode, unspecified: Secondary | ICD-10-CM

## 2019-05-17 DIAGNOSIS — F419 Anxiety disorder, unspecified: Secondary | ICD-10-CM

## 2019-06-10 DIAGNOSIS — F331 Major depressive disorder, recurrent, moderate: Secondary | ICD-10-CM | POA: Diagnosis not present

## 2019-06-10 DIAGNOSIS — F411 Generalized anxiety disorder: Secondary | ICD-10-CM | POA: Diagnosis not present

## 2019-06-17 DIAGNOSIS — F331 Major depressive disorder, recurrent, moderate: Secondary | ICD-10-CM | POA: Diagnosis not present

## 2019-06-17 DIAGNOSIS — F411 Generalized anxiety disorder: Secondary | ICD-10-CM | POA: Diagnosis not present

## 2019-06-20 ENCOUNTER — Other Ambulatory Visit: Payer: Self-pay | Admitting: Obstetrics and Gynecology

## 2019-06-20 DIAGNOSIS — F329 Major depressive disorder, single episode, unspecified: Secondary | ICD-10-CM

## 2019-06-20 DIAGNOSIS — F419 Anxiety disorder, unspecified: Secondary | ICD-10-CM

## 2019-06-21 ENCOUNTER — Encounter: Payer: Self-pay | Admitting: Obstetrics and Gynecology

## 2019-06-21 ENCOUNTER — Other Ambulatory Visit: Payer: Self-pay | Admitting: Obstetrics and Gynecology

## 2019-06-21 DIAGNOSIS — F419 Anxiety disorder, unspecified: Secondary | ICD-10-CM

## 2019-06-21 DIAGNOSIS — F329 Major depressive disorder, single episode, unspecified: Secondary | ICD-10-CM

## 2019-06-21 MED ORDER — SERTRALINE HCL 100 MG PO TABS: 100.0000 mg | ORAL_TABLET | Freq: Every day | ORAL | 0 refills | Status: AC

## 2019-06-21 NOTE — Telephone Encounter (Signed)
Called pt (both #'s in chart) no answer, LVMTRC.

## 2019-06-21 NOTE — Progress Notes (Signed)
Rx RF zoloft for anxiety/depression. 100 mg dose better for sx control. Annual due 3/21.

## 2019-06-21 NOTE — Telephone Encounter (Signed)
GAD=11, PHQ=10. Sx improved from 12/20 on sertraline 100 mg dose. Rx RF for 3 months. Annual due 3/21 but can come April or May instead.

## 2019-06-21 NOTE — Telephone Encounter (Signed)
Pls call pt and let her know I got her msg. Glad she is doing better. Can you update GAD/PHQ on her? Also ask if she is sleeping better.  I can then send in RF for her. Thx.

## 2019-06-21 NOTE — Telephone Encounter (Signed)
Pt says "yeah, but not really" to the sleeping better. GAD7/PHQ9 on your desk.

## 2019-06-22 DIAGNOSIS — F411 Generalized anxiety disorder: Secondary | ICD-10-CM | POA: Diagnosis not present

## 2019-06-22 DIAGNOSIS — F331 Major depressive disorder, recurrent, moderate: Secondary | ICD-10-CM | POA: Diagnosis not present

## 2019-06-29 DIAGNOSIS — F411 Generalized anxiety disorder: Secondary | ICD-10-CM | POA: Diagnosis not present

## 2019-06-29 DIAGNOSIS — F331 Major depressive disorder, recurrent, moderate: Secondary | ICD-10-CM | POA: Diagnosis not present

## 2019-07-07 DIAGNOSIS — F331 Major depressive disorder, recurrent, moderate: Secondary | ICD-10-CM | POA: Diagnosis not present

## 2019-07-07 DIAGNOSIS — F411 Generalized anxiety disorder: Secondary | ICD-10-CM | POA: Diagnosis not present

## 2019-07-15 DIAGNOSIS — Z20822 Contact with and (suspected) exposure to covid-19: Secondary | ICD-10-CM | POA: Diagnosis not present

## 2019-07-21 DIAGNOSIS — F411 Generalized anxiety disorder: Secondary | ICD-10-CM | POA: Diagnosis not present

## 2019-07-21 DIAGNOSIS — F331 Major depressive disorder, recurrent, moderate: Secondary | ICD-10-CM | POA: Diagnosis not present

## 2019-07-26 ENCOUNTER — Other Ambulatory Visit: Payer: Self-pay | Admitting: Obstetrics and Gynecology

## 2019-07-26 ENCOUNTER — Encounter: Payer: Self-pay | Admitting: Obstetrics and Gynecology

## 2019-07-26 MED ORDER — TRAZODONE HCL 50 MG PO TABS: 50.0000 mg | ORAL_TABLET | Freq: Every evening | ORAL | 1 refills | Status: AC | PRN

## 2019-07-26 NOTE — Progress Notes (Signed)
Rx trazodone for insomnia. On sertraline without relief.

## 2019-08-02 DIAGNOSIS — F411 Generalized anxiety disorder: Secondary | ICD-10-CM | POA: Diagnosis not present

## 2019-08-02 DIAGNOSIS — F331 Major depressive disorder, recurrent, moderate: Secondary | ICD-10-CM | POA: Diagnosis not present

## 2019-08-02 DIAGNOSIS — F321 Major depressive disorder, single episode, moderate: Secondary | ICD-10-CM | POA: Diagnosis not present

## 2019-09-01 DIAGNOSIS — F321 Major depressive disorder, single episode, moderate: Secondary | ICD-10-CM | POA: Diagnosis not present

## 2019-09-15 ENCOUNTER — Encounter: Payer: Self-pay | Admitting: Obstetrics and Gynecology

## 2019-09-15 DIAGNOSIS — F411 Generalized anxiety disorder: Secondary | ICD-10-CM | POA: Diagnosis not present

## 2019-09-15 DIAGNOSIS — F900 Attention-deficit hyperactivity disorder, predominantly inattentive type: Secondary | ICD-10-CM | POA: Diagnosis not present

## 2019-09-15 DIAGNOSIS — F321 Major depressive disorder, single episode, moderate: Secondary | ICD-10-CM | POA: Diagnosis not present

## 2019-09-18 ENCOUNTER — Other Ambulatory Visit: Payer: Self-pay | Admitting: Obstetrics and Gynecology

## 2019-09-18 DIAGNOSIS — F329 Major depressive disorder, single episode, unspecified: Secondary | ICD-10-CM

## 2019-09-18 DIAGNOSIS — F419 Anxiety disorder, unspecified: Secondary | ICD-10-CM

## 2019-09-28 DIAGNOSIS — J209 Acute bronchitis, unspecified: Secondary | ICD-10-CM | POA: Insufficient documentation

## 2019-10-07 ENCOUNTER — Ambulatory Visit (INDEPENDENT_AMBULATORY_CARE_PROVIDER_SITE_OTHER): Payer: BC Managed Care – PPO | Admitting: Nurse Practitioner

## 2019-10-07 ENCOUNTER — Encounter: Payer: Self-pay | Admitting: Nurse Practitioner

## 2019-10-07 ENCOUNTER — Other Ambulatory Visit: Payer: Self-pay

## 2019-10-07 VITALS — BP 130/62 | HR 58 | Temp 98.0°F | Ht 66.0 in | Wt 210.0 lb

## 2019-10-07 DIAGNOSIS — F902 Attention-deficit hyperactivity disorder, combined type: Secondary | ICD-10-CM | POA: Diagnosis not present

## 2019-10-07 DIAGNOSIS — F339 Major depressive disorder, recurrent, unspecified: Secondary | ICD-10-CM | POA: Diagnosis not present

## 2019-10-07 DIAGNOSIS — F419 Anxiety disorder, unspecified: Secondary | ICD-10-CM

## 2019-10-07 NOTE — Progress Notes (Signed)
Pre visit review using our clinic review tool, if applicable. No additional management support is needed unless otherwise documented below in the visit note. 

## 2019-10-07 NOTE — Patient Instructions (Signed)
It was nice to meet you today.   I recommend a Psychiatrist manage your depression/anxiety and ADHD. With your permission, I have placed a referral in to Dr. Arvilla Market and her office will call you.

## 2019-10-07 NOTE — Progress Notes (Signed)
New Patient Office Visit  Subjective:  Patient ID: Karen Guerra, female    DOB: 1995-05-16  Age: 24 y.o. MRN: [id number]  CC:  Chief Complaint  Patient presents with  . Establish Care    HPI Karen Guerra presents to establish care with a provider who can give her a Rx for a new start ADHD medication.   She is already established with Ardeth Perfect, PA at [facility] as her PCP who prescribes Zoloft 50 mg daily for depression and trazodone for insomnia.  She was sent for formal ADHD testing, and brings in that evaluation to review.  She has psychiatric diagnosis of depression, generalized anxiety disorder, and ADHD, predominantly inattentive presentation.  It is Dr. Rogelia Boga recommendation that she continue seeking psychiatric care with her PCP or with another psychiatric provider.The patient reports that her PCP will not prescribe Adderall for her. She sees a behavioral therapist who cannot prescribe. She reports no SI/HI. She reports no mental health hospitalization.  Her boyfriend takes Vyvanse.  She knows that she cannot afford that medication so she is requesting a generic Adderall.  Her main concerns with ADHD is fidgeting, forgetting to complete one project before going on to the next, staying focused on the job at hand.    Past Medical History:  Diagnosis Date  . Anxiety   . Chicken pox   . PCOS (polycystic ovarian syndrome) 2016   IUD    Past Surgical History:  Procedure Laterality Date  . broken arm Left 2000  . WISDOM TOOTH EXTRACTION      Family History  Problem Relation Age of Onset  . Hyperlipidemia Mother   . Hyperlipidemia Maternal Grandmother   . Hypertension Maternal Grandmother   . Cancer Maternal Grandfather        Lung Cancer  . Hyperlipidemia Maternal Grandfather   . Hypertension Maternal Grandfather   . Diabetes Maternal Grandfather   . Hyperlipidemia Paternal Grandmother   . Non-Hodgkin's lymphoma Paternal Grandmother   . Hyperlipidemia  Paternal Grandfather   . Heart disease Paternal Grandfather   . Hypertension Paternal Grandfather   . Hypertension Other   . Hypertension Other   . Prostate cancer Other     Social History   Socioeconomic History  . Marital status: Single    Spouse name: Not on file  . Number of children: Not on file  . Years of education: Not on file  . Highest education level: Not on file  Occupational History  . Not on file  Tobacco Use  . Smoking status: Never Smoker  . Smokeless tobacco: Never Used  Substance and Sexual Activity  . Alcohol use: No    Alcohol/week: 0.0 standard drinks  . Drug use: No  . Sexual activity: Not Currently    Partners: Male    Birth control/protection: I.U.D.    Comment: Liletta  Other Topics Concern  . 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 Difficulty of Paying Living Expenses:   Food Insecurity:   . Worried About Charity fundraiser in the Last Year:   . Arboriculturist in the Last Year:   Transportation Needs:   . Film/video editor (Medical):   Marland Kitchen  Lack of Transportation (Non-Medical):   Physical Activity:   . Days of Exercise per Week:   . Minutes of Exercise per Session:   Stress:   . Feeling of Stress :   Social Connections:   . Frequency of Communication with Friends and Family:   . Frequency of Social Gatherings with Friends and Family:   . Attends Religious Services:   . Active Member of Clubs or Organizations:   . Attends Banker Meetings:   Marland Kitchen Marital Status:   Intimate Partner Violence:   . Fear of Current or Ex-Partner:   . Emotionally Abused:   Marland Kitchen Physically Abused:   . Sexually Abused:     ROS Pertinent positives noted in history of present illness.   Objective:    Today's Vitals: BP 130/62   Pulse (!) 58   Temp 98 F (36.7 C) (Skin)   Ht 5\' 6"  (1.676 m)   Wt 210 lb (95.3 kg)   SpO2 98%   BMI 33.89 kg/m   Physical Exam Constitutional:      Appearance: Normal appearance.  Cardiovascular:     Rate and Rhythm: Normal rate and regular rhythm.     Pulses: Normal pulses.     Heart sounds: Normal heart sounds.  Pulmonary:     Effort: Pulmonary effort is normal.     Breath sounds: Normal breath sounds.  Neurological:     Mental Status: She is alert.  Psychiatric:        Mood and Affect: Mood normal.        Behavior: Behavior normal.        Thought Content: Thought content normal.        Judgment: Judgment normal.     Assessment & Plan:   Problem List Items Addressed This Visit      Other   Anxiety disorder   Attention deficit hyperactivity disorder (ADHD), combined type - Primary   Relevant Orders   Ambulatory referral to Psychiatry   Depression, recurrent [facility])      Outpatient Encounter Medications as of 10/07/2019  Medication Sig  . sertraline (ZOLOFT) 100 MG tablet Take 1 tablet (100 mg total) by mouth daily.  . traZODone (DESYREL) 50 MG tablet Take 1 tablet (50 mg total) by mouth at bedtime as needed for sleep.  . [DISCONTINUED] albuterol (VENTOLIN HFA) 108 (90 Base) MCG/ACT inhaler 1-2 inhalations every 4-6 hours as needed for wheezing. Dispense spacer as needed.  . [DISCONTINUED] Levonorgestrel (LILETTA) 19.5 MCG/DAY IUD IUD 1 Intra Uterine Device (1 each total) by Intrauterine route once for 1 dose.   No facility-administered encounter medications on file as of 10/07/2019.   I recommend a Psychiatrist manage your depression/anxiety and ADHD. With your permission, I have placed a referral in to Dr. 10/09/2019 and her office will call you.  The patient is in agreement with this plan.  She will continue with Arvilla Market, PA  at [facility] as her primary care provider.  Follow-up: Return if symptoms worsen or fail to  improve.   [facility], NP

## 2019-10-09 ENCOUNTER — Encounter: Payer: Self-pay | Admitting: Nurse Practitioner

## 2019-11-09 ENCOUNTER — Telehealth: Payer: Self-pay | Admitting: Nurse Practitioner

## 2019-11-09 NOTE — Telephone Encounter (Signed)
Gave pt number to call

## 2019-11-09 NOTE — Telephone Encounter (Signed)
Left pt vm to call ofc regarding referral to Dr Maryruth Bun ofc. Pt can call to sch [phone].

## 2019-12-08 ENCOUNTER — Telehealth: Payer: Self-pay | Admitting: Nurse Practitioner

## 2019-12-08 NOTE — Telephone Encounter (Signed)
lft vm for pt to call ofc regarding referral to psy.

## 2020-01-27 ENCOUNTER — Encounter: Payer: Self-pay | Admitting: Nurse Practitioner

## 2020-10-31 ENCOUNTER — Ambulatory Visit: Payer: Self-pay | Admitting: Obstetrics and Gynecology

## 2021-01-22 ENCOUNTER — Encounter: Payer: BC Managed Care – PPO | Admitting: Adult Health

## 2021-02-02 ENCOUNTER — Ambulatory Visit
Admission: RE | Admit: 2021-02-02 | Discharge: 2021-02-02 | Disposition: A | Discharge status: Home / Self Care | Payer: BLUE CROSS/BLUE SHIELD | Source: Ambulatory Visit | Attending: Emergency Medicine | Admitting: Emergency Medicine

## 2021-02-02 ENCOUNTER — Other Ambulatory Visit: Payer: Self-pay

## 2021-02-02 VITALS — BP 129/79 | HR 77 | Temp 97.8°F | Resp 18 | Ht 67.0 in | Wt 195.0 lb

## 2021-02-02 DIAGNOSIS — J069 Acute upper respiratory infection, unspecified: Secondary | ICD-10-CM | POA: Insufficient documentation

## 2021-02-02 DIAGNOSIS — H6503 Acute serous otitis media, bilateral: Secondary | ICD-10-CM | POA: Insufficient documentation

## 2021-02-02 DIAGNOSIS — Z20822 Contact with and (suspected) exposure to covid-19: Secondary | ICD-10-CM | POA: Diagnosis not present

## 2021-02-02 MED ORDER — BENZONATATE 200 MG PO CAPS
200.0000 mg | ORAL_CAPSULE | Freq: Three times a day (TID) | ORAL | 0 refills | Status: AC | PRN
Start: 2021-02-02 — End: ?

## 2021-02-02 MED ORDER — FLUTICASONE PROPIONATE 50 MCG/ACT NA SUSP
2.0000 | Freq: Every day | NASAL | 0 refills | Status: AC
Start: 2021-02-02 — End: ?

## 2021-02-02 MED ORDER — AMOXICILLIN-POT CLAVULANATE 875-125 MG PO TABS: 1.0000 | ORAL_TABLET | Freq: Two times a day (BID) | ORAL | 0 refills | Status: AC

## 2021-02-02 NOTE — Discharge Instructions (Addendum)
Mucinex D, saline nasal irrigation with a Lloyd Huger Med rinse and distilled water as often as you want, Flonase for the nasal congestion.  Tessalon for the cough.  I would wait another 2 or 3 days before filling the Augmentin.  Have fluid behind your ear, but it does not appear infected.  If you get worse in the interim, go ahead and start the antibiotics earlier

## 2021-02-02 NOTE — ED Provider Notes (Addendum)
HPI  SUBJECTIVE:  Karen Guerra is a 25 y.o. female who presents with 1 week of nasal congestion, green rhinorrhea, cough productive the same material as her rhinorrhea, bilateral ear pain, muffled hearing.  She reports chest congestion, postnasal drip and wheezing present with coughing only.  No fevers above 100.4, double sickening.  No body aches, headaches, sinus pain or pressure, facial swelling, upper dental pain, sore throat, chest pain, shortness of breath, nausea, vomiting, diarrhea, abdominal pain.  No known COVID exposure.  She got the second dose of the COVID-vaccine.  No allergy symptoms.  No antibiotics in the past month.  No antipyretic in the past 6 hours.  She has tried Benadryl and Coricidin cold and flu.  The procedure and helps.  No aggravating factors.  She has a past medical history of PCOS.  LMP: 1 month ago.  Denies the possibility of being pregnant.  PMD: She is establishing care at [facility] primary care    Past Medical History:  Diagnosis Date   Anxiety    Chicken pox    PCOS (polycystic ovarian syndrome) 2016   IUD    Past Surgical History:  Procedure Laterality Date   broken arm Left 2000   WISDOM TOOTH EXTRACTION      Family History  Problem Relation Age of Onset   Hyperlipidemia Mother    Hyperlipidemia Maternal Grandmother    Hypertension Maternal Grandmother    Cancer Maternal Grandfather        Lung Cancer   Hyperlipidemia Maternal Grandfather    Hypertension Maternal Grandfather    Diabetes Maternal Grandfather    Hyperlipidemia Paternal Grandmother    Non-Hodgkin's lymphoma Paternal Grandmother    Hyperlipidemia Paternal Grandfather    Heart disease Paternal Grandfather    Hypertension Paternal Grandfather    Hypertension Other    Hypertension Other    Prostate cancer Other     Social History   Tobacco Use   Smoking status: Never   Smokeless tobacco: Never  Vaping Use   Vaping Use: Never used  Substance Use Topics   Alcohol use: No     Alcohol/week: 0.0 standard drinks   Drug use: No    No current facility-administered medications for this encounter.  Current Outpatient Medications:    amoxicillin-clavulanate (AUGMENTIN) 875-125 MG tablet, Take 1 tablet by mouth 2 (two) times daily for 7 days., Disp: 14 tablet, Rfl: 0   benzonatate (TESSALON) 200 MG capsule, Take 1 capsule (200 mg total) by mouth 3 (three) times daily as needed for cough., Disp: 30 capsule, Rfl: 0   fluticasone (FLONASE) 50 MCG/ACT nasal spray, Place 2 sprays into both nostrils daily., Disp: 16 g, Rfl: 0   sertraline (ZOLOFT) 100 MG tablet, Take 1 tablet (100 mg total) by mouth daily., Disp: 90 tablet, Rfl: 0   traZODone (DESYREL) 50 MG tablet, Take 1 tablet (50 mg total) by mouth at bedtime as needed for sleep., Disp: 30 tablet, Rfl: 1  Allergies  Allergen Reactions   Azithromycin Hives     ROS  As noted in HPI.   Physical Exam  BP 129/79 (BP Location: Left Arm)   Pulse 77   Temp 97.8 F (36.6 C) (Oral)   Resp 18   Ht 5\' 7"  (1.702 m)   Wt 88.5 kg   SpO2 100%   BMI 30.54 kg/m   Constitutional: Well developed, well nourished, no acute distress.  Coughing Eyes:  EOMI, conjunctiva normal bilaterally HENT: Normocephalic, atraumatic,mucus membranes moist.  Extensive purulent nasal  congestion.  Erythematous, swollen turbinates.  No maxillary, frontal sinus tenderness.  Right TM red, dull, with fluid behind the TM.  Left TM with yellowish fluid behind the TM.  Positive postnasal drip.  Normal oropharynx. Respiratory: Normal inspiratory effort, lungs clear bilaterally Cardiovascular: Normal rate regular rhythm no murmurs rubs or gallops GI: nondistended skin: No rash, skin intact Musculoskeletal: no deformities Neurologic: Alert & oriented x 3, no focal neuro deficits Psychiatric: Speech and behavior appropriate   ED Course   Medications - No data to display  Orders Placed This Encounter  Procedures   SARS CORONAVIRUS 2 (TAT 6-24  HRS) Nasopharyngeal Nasopharyngeal Swab    Standing Status:   Standing    Number of Occurrences:   1    Order Specific Question:   Is this test for diagnosis or screening    Answer:   Diagnosis of ill patient    Order Specific Question:   Symptomatic for COVID-19 as defined by CDC    Answer:   Yes    Order Specific Question:   Date of Symptom Onset    Answer:   01/26/2021    Order Specific Question:   Hospitalized for COVID-19    Answer:   No    Order Specific Question:   Admitted to ICU for COVID-19    Answer:   No    Order Specific Question:   Previously tested for COVID-19    Answer:   No    Order Specific Question:   Resident in a congregate (group) care setting    Answer:   No    Order Specific Question:   Employed in healthcare setting    Answer:   No    Order Specific Question:   Pregnant    Answer:   No    Order Specific Question:   Has patient completed COVID vaccination(s) (2 doses of Pfizer/Moderna 1 dose of [employer])    Answer:   Yes    Order Specific Question:   Has patient completed COVID Booster / 3rd dose    Answer:   No    Results for orders placed or performed during the hospital encounter of 02/02/21 (from the past 24 hour(s))  SARS CORONAVIRUS 2 (TAT 6-24 HRS) Nasopharyngeal Nasopharyngeal Swab     Status: None   Collection Time: 02/02/21  9:16 AM   Specimen: Nasopharyngeal Swab  Result Value Ref Range   SARS Coronavirus 2 NEGATIVE NEGATIVE   No results found.  ED Clinical Impression  1. Viral URI with cough   2. Non-recurrent acute serous otitis media of both ears   3. Encounter for laboratory testing for COVID-19 virus      ED Assessment/Plan  Patient with a URI and bilateral serous otitis, the right TM is red.  It does not appear to be purulent drainage.  Will send home with Mucinex D, saline nasal Acacian, Flonase, Tessalon, discontinue antihistamine.  Wait-and-see prescription of Augmentin.  If she is not better in 48 hours or if she gets  worse, she will start this.  She is requesting COVID testing.  She is out of the window for antiviral treatment.  Follow-up with PMD as needed.  COVID negative  Discussed  MDM, treatment plan, and plan for follow-up with patient. . patient agrees with plan.   Meds ordered this encounter  Medications   amoxicillin-clavulanate (AUGMENTIN) 875-125 MG tablet    Sig: Take 1 tablet by mouth 2 (two) times daily for 7 days.  Dispense:  14 tablet    Refill:  0   fluticasone (FLONASE) 50 MCG/ACT nasal spray    Sig: Place 2 sprays into both nostrils daily.    Dispense:  16 g    Refill:  0   benzonatate (TESSALON) 200 MG capsule    Sig: Take 1 capsule (200 mg total) by mouth 3 (three) times daily as needed for cough.    Dispense:  30 capsule    Refill:  0      *This clinic note was created using Scientist, clinical (histocompatibility and immunogenetics). Therefore, there may be occasional mistakes despite careful proofreading.  ?    Domenick Gong, MD 02/02/21 [id number]    Domenick Gong, MD 02/03/21 [phone]

## 2021-02-02 NOTE — ED Triage Notes (Signed)
Pt c/o fever for 2 days (99.9), cough, nasal congestion and bilateral ear fullness. Pt denies n/v/d or other symptoms.

## 2021-02-03 LAB — SARS CORONAVIRUS 2 (TAT 6-24 HRS): SARS Coronavirus 2: NEGATIVE

## 2024-03-02 ENCOUNTER — Other Ambulatory Visit: Payer: Self-pay

## 2024-03-02 DIAGNOSIS — R112 Nausea with vomiting, unspecified: Secondary | ICD-10-CM | POA: Insufficient documentation

## 2024-03-02 DIAGNOSIS — R1030 Lower abdominal pain, unspecified: Secondary | ICD-10-CM | POA: Insufficient documentation

## 2024-03-02 DIAGNOSIS — Z5321 Procedure and treatment not carried out due to patient leaving prior to being seen by health care provider: Secondary | ICD-10-CM | POA: Insufficient documentation

## 2024-03-02 LAB — COMPREHENSIVE METABOLIC PANEL WITH GFR
ALT: 26 U/L (ref 0–44)
AST: 17 U/L (ref 15–41)
Albumin: 4.2 g/dL (ref 3.5–5.0)
Alkaline Phosphatase: 59 U/L (ref 38–126)
Anion gap: 14 (ref 5–15)
BUN: 13 mg/dL (ref 6–20)
CO2: 22 mmol/L (ref 22–32)
Calcium: 9.8 mg/dL (ref 8.9–10.3)
Chloride: 105 mmol/L (ref 98–111)
Creatinine, Ser: 0.65 mg/dL (ref 0.44–1.00)
GFR, Estimated: >60 mL/min (ref >60)
Glucose, Bld: 115 mg/dL — ABNORMAL HIGH (ref 70–99)
Potassium: 3.7 mmol/L (ref 3.5–5.1)
Sodium: 141 mmol/L (ref 135–145)
Total Bilirubin: 1.4 mg/dL — ABNORMAL HIGH (ref 0.0–1.2)
Total Protein: 8.4 g/dL — ABNORMAL HIGH (ref 6.5–8.1)

## 2024-03-02 LAB — URINALYSIS, ROUTINE W REFLEX MICROSCOPIC
Bacteria, UA: NONE SEEN
Bilirubin Urine: NEGATIVE
Glucose, UA: NEGATIVE mg/dL
Hgb urine dipstick: NEGATIVE
Ketones, ur: 5 mg/dL — AB
Nitrite: NEGATIVE
Protein, ur: 30 mg/dL — AB
Specific Gravity, Urine: 1.031 — ABNORMAL HIGH (ref 1.005–1.030)
pH: 5 (ref 5.0–8.0)

## 2024-03-02 LAB — LIPASE, BLOOD: Lipase: 28 U/L (ref 11–51)

## 2024-03-02 LAB — POC URINE PREG, ED: Preg Test, Ur: NEGATIVE

## 2024-03-02 NOTE — ED Triage Notes (Signed)
 Pt reports lower abd pain n/v that began tonight. Pt denies dysuria or vaginal bleeding.

## 2024-03-02 NOTE — ED Notes (Signed)
 Pt unable to urinate at this time, pt given a specimen cup and instructed once she provides a sample to bring it to the desk.

## 2024-03-03 ENCOUNTER — Emergency Department
Admission: EM | Admit: 2024-03-03 | Discharge: 2024-03-03 | Discharge status: Against Medical Advice | Payer: Self-pay | Attending: Emergency Medicine | Admitting: Emergency Medicine

## 2024-03-03 LAB — CBC
HCT: 45.6 % (ref 36.0–46.0)
Hemoglobin: 14.6 g/dL (ref 12.0–15.0)
MCH: 21.6 pg — ABNORMAL LOW (ref 26.0–34.0)
MCHC: 32 g/dL (ref 30.0–36.0)
MCV: 67.6 fL — ABNORMAL LOW (ref 80.0–100.0)
Platelets: 401 K/uL — ABNORMAL HIGH (ref 150–400)
RBC: 6.75 MIL/uL — ABNORMAL HIGH (ref 3.87–5.11)
RDW: 15.9 % — ABNORMAL HIGH (ref 11.5–15.5)
WBC: 12.8 K/uL — ABNORMAL HIGH (ref 4.0–10.5)
nRBC: 0 % (ref 0.0–0.2)
